# Patient Record
Sex: Female | Born: 2005 | Marital: Single
Health system: Southern US, Community
[De-identification: ages and names within clinical notes are randomized; demographics above are authoritative.]

---
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
## 2023-12-16 ENCOUNTER — Ambulatory Visit (INDEPENDENT_AMBULATORY_CARE_PROVIDER_SITE_OTHER): Admitting: Family Medicine

## 2023-12-16 ENCOUNTER — Encounter: Payer: Self-pay | Admitting: Family Medicine

## 2023-12-16 VITALS — BP 109/67 | HR 72 | Ht 59.0 in | Wt 138.6 lb

## 2023-12-16 DIAGNOSIS — Z0000 Encounter for general adult medical examination without abnormal findings: Secondary | ICD-10-CM

## 2023-12-16 NOTE — Patient Instructions (Signed)
 It was wonderful to see you today! Thank you for choosing [facility].   Please bring ALL of your medications with you to every visit.   Today we talked about:  I am glad things are going well for you please continue to focus on a healthy diet and get 150 minutes of exercise per week.  I recommend following up with the dentist when available and at the very least brushing twice per day and rinsing out your mouth after eating anything sweet.  If you have any difficulties with your vision please return to get your vision checked in room or you can order glasses online for much cheaper than in the store. For the stretch marks you have you can use the silicone scar pads over the areas which may help a little bit but as I mentioned this is likely genetic and they are very hard to get rid of.  Keeping your skin very moisturized every night can help prevent them from occurring.  To get to the point where you really want them gone the neck step would be to go to dermatology to discuss laser options but as a mention this is not something that is covered by insurance.   Please follow up in 1 year for annual physical  If you haven't already, sign up for My Chart to have easy access to your labs results, and communication with your primary care physician.  Call the clinic at [phone] if your symptoms worsen or you have any concerns.  Please be sure to schedule follow up at the front desk before you leave today.   Jonne Netters, DO Family Medicine

## 2023-12-16 NOTE — Progress Notes (Signed)
    SUBJECTIVE:   Chief compliant/HPI: annual examination  Kellie Brown is a 18 y.o. who presents today for an annual exam.   Social Lives with family Works in a [employer] - [employer]. Walking all day.  Interested in school, still sorting it out Has never been sexually active Denies any alcohol or substance use  Updated history tabs and problem list.   OBJECTIVE:   BP 109/67   Pulse 72   Ht 4' 11 (1.499 m)   Wt 138 lb 9.6 oz (62.9 kg)   LMP 11/13/2023   SpO2 99%   BMI 27.99 kg/m    General: Well-appearing. Alert. NAD HEENT: Normocephalic. White sclera. TM clear bilaterally. No rhinorrhea or congestion CV: RRR without murmur Pulm: CTAB. Normal WOB on RA. No wheezing Abdomen: Soft, non-tender, non-distended. +BS Ext: Well perfused. Cap refill < 3 seconds Skin: Warm, dry. No rashes noted   ASSESSMENT/PLAN:   Annual Examination  See AVS for age appropriate recommendations.   PHQ score 0, reviewed and discussed. Blood pressure reviewed and at goal.  Asked about intimate partner violence and patient reports none.  The patient currently uses nothing for contraception as she has never been sexually active.  Folate recommended as appropriate, minimum of 400 mcg per day.    Considered the following items based upon USPSTF recommendations: HIV testing: Declined Hepatitis C: Declined Hepatitis B: Declined Syphilis if at high risk: Declined GC/CT Declined Lipid panel (nonfasting or fasting) discussed based upon AHA recommendations and not ordered.  Consider repeat every 4-6 years.  Reviewed risk factors for latent tuberculosis and not indicated  Discussed family history, BRCA testing not indicated. Cervical cancer screening: not indicated as not yet age 62.  Immunizations: Needs men B but not available in the clinic MyChart Activation:Signed up today   Follow up in 1 year or sooner if indicated.    Jonne Netters, MD [facility]
